# Patient Record
Sex: Female | Born: 2000 | Race: Black or African American | Hispanic: No | Marital: Single | Smoking: Never smoker
Health system: Southern US, Community
[De-identification: ages and names within clinical notes are randomized; demographics above are authoritative.]

## PROBLEM LIST (undated history)

## (undated) DIAGNOSIS — J45909 Unspecified asthma, uncomplicated: Secondary | ICD-10-CM

## (undated) HISTORY — DX: Unspecified asthma, uncomplicated: J45.909

---
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
## 2023-06-07 NOTE — Telephone Encounter (Signed)
Dr. Ermalene Searing removed at PCP.

## 2023-07-05 ENCOUNTER — Telehealth: Payer: Self-pay

## 2023-07-05 NOTE — Telephone Encounter (Signed)
Patient called in and stated that she needs a copy of her immunization records faxed over to her doctors office. She stated that she is there now for an appointment. There fax number is [phone]. Thank you!

## 2023-07-05 NOTE — Telephone Encounter (Signed)
Immunization record faxed at requested to [phone].
